# Patient Record
Sex: Female | Born: 2000
Health system: Southern US, Community
[De-identification: ages and names within clinical notes are randomized; demographics above are authoritative.]

## PROBLEM LIST (undated history)

## (undated) HISTORY — PX: OTHER SURGICAL HISTORY: SHX169

---
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
## 2018-04-20 DIAGNOSIS — Z0000 Encounter for general adult medical examination without abnormal findings: Secondary | ICD-10-CM | POA: Diagnosis not present

## 2018-04-20 DIAGNOSIS — Z68.41 Body mass index (BMI) pediatric, 5th percentile to less than 85th percentile for age: Secondary | ICD-10-CM | POA: Diagnosis not present

## 2018-04-20 DIAGNOSIS — Z23 Encounter for immunization: Secondary | ICD-10-CM | POA: Diagnosis not present

## 2018-04-20 DIAGNOSIS — Z713 Dietary counseling and surveillance: Secondary | ICD-10-CM | POA: Diagnosis not present

## 2018-04-21 DIAGNOSIS — Z0000 Encounter for general adult medical examination without abnormal findings: Secondary | ICD-10-CM | POA: Diagnosis not present

## 2018-07-21 DIAGNOSIS — H00014 Hordeolum externum left upper eyelid: Secondary | ICD-10-CM | POA: Diagnosis not present

## 2019-06-25 DIAGNOSIS — Z7189 Other specified counseling: Secondary | ICD-10-CM | POA: Diagnosis not present

## 2019-06-25 DIAGNOSIS — Z20828 Contact with and (suspected) exposure to other viral communicable diseases: Secondary | ICD-10-CM | POA: Diagnosis not present

## 2019-07-13 DIAGNOSIS — Z20828 Contact with and (suspected) exposure to other viral communicable diseases: Secondary | ICD-10-CM | POA: Diagnosis not present

## 2019-08-17 DIAGNOSIS — Z20828 Contact with and (suspected) exposure to other viral communicable diseases: Secondary | ICD-10-CM | POA: Diagnosis not present

## 2019-09-07 DIAGNOSIS — R05 Cough: Secondary | ICD-10-CM | POA: Diagnosis not present

## 2019-09-07 DIAGNOSIS — Z20828 Contact with and (suspected) exposure to other viral communicable diseases: Secondary | ICD-10-CM | POA: Diagnosis not present

## 2019-09-07 DIAGNOSIS — Z7189 Other specified counseling: Secondary | ICD-10-CM | POA: Diagnosis not present

## 2020-01-06 DIAGNOSIS — Z1389 Encounter for screening for other disorder: Secondary | ICD-10-CM | POA: Diagnosis not present

## 2020-01-06 DIAGNOSIS — R52 Pain, unspecified: Secondary | ICD-10-CM | POA: Diagnosis not present

## 2020-01-06 DIAGNOSIS — R5382 Chronic fatigue, unspecified: Secondary | ICD-10-CM | POA: Diagnosis not present

## 2020-01-06 DIAGNOSIS — Z13220 Encounter for screening for lipoid disorders: Secondary | ICD-10-CM | POA: Diagnosis not present

## 2020-01-06 DIAGNOSIS — R519 Headache, unspecified: Secondary | ICD-10-CM | POA: Diagnosis not present

## 2020-01-06 DIAGNOSIS — H53133 Sudden visual loss, bilateral: Secondary | ICD-10-CM | POA: Diagnosis not present

## 2020-01-10 ENCOUNTER — Encounter: Payer: Self-pay | Admitting: Neurology

## 2020-01-13 DIAGNOSIS — H532 Diplopia: Secondary | ICD-10-CM | POA: Diagnosis not present

## 2020-01-13 DIAGNOSIS — H5213 Myopia, bilateral: Secondary | ICD-10-CM | POA: Diagnosis not present

## 2020-01-22 ENCOUNTER — Ambulatory Visit: Payer: BC Managed Care – PPO | Attending: Internal Medicine

## 2020-01-22 DIAGNOSIS — Z23 Encounter for immunization: Secondary | ICD-10-CM

## 2020-01-22 NOTE — Progress Notes (Signed)
   Covid-19 Vaccination Clinic  Name:  Leocadia Idleman    MRN: [id number] DOB: Mar 21, 2001  01/22/2020    Alcaide was observed post Covid-19 immunization for 15 minutes without incident. Rubie Maid was provided with Vaccine Information Sheet and instruction to access the V-Safe system.     Miskell was instructed to call 911 with any severe reactions post vaccine: Marland Kitchen Difficulty breathing  . Swelling of face and throat  . A fast heartbeat  . A bad rash all over body  . Dizziness and weakness   Immunizations Administered    Name Date Dose VIS Date Route   Pfizer COVID-19 Vaccine 01/22/2020 12:16 PM 0.3 mL 10/22/2019 Intramuscular   Manufacturer: [employer], [employer]   Lot: [id number]   NDC: [address]-7

## 2020-01-25 DIAGNOSIS — Z7189 Other specified counseling: Secondary | ICD-10-CM | POA: Diagnosis not present

## 2020-01-25 DIAGNOSIS — Z20828 Contact with and (suspected) exposure to other viral communicable diseases: Secondary | ICD-10-CM | POA: Diagnosis not present

## 2020-02-14 ENCOUNTER — Ambulatory Visit: Payer: BC Managed Care – PPO | Attending: Internal Medicine

## 2020-02-14 ENCOUNTER — Ambulatory Visit: Payer: BC Managed Care – PPO

## 2020-02-14 DIAGNOSIS — Z23 Encounter for immunization: Secondary | ICD-10-CM

## 2020-02-14 NOTE — Progress Notes (Signed)
   Covid-19 Vaccination Clinic  Name:  Margurite Duffy    MRN: [id number] DOB: [birth year]  02/14/2020    Maslin was observed post Covid-19 immunization for 15 minutes without incident. Rubie Maid was provided with Vaccine Information Sheet and instruction to access the V-Safe system.     Balthazar was instructed to call 911 with any severe reactions post vaccine: Marland Kitchen Difficulty breathing  . Swelling of face and throat  . A fast heartbeat  . A bad rash all over body  . Dizziness and weakness   Immunizations Administered    Name Date Dose VIS Date Route   Pfizer COVID-19 Vaccine 02/14/2020  9:00 AM 0.3 mL 10/22/2019 Intramuscular   Manufacturer: [employer], [employer]   Lot: [id number]   NDC: [address]-6

## 2020-02-15 DIAGNOSIS — Z03818 Encounter for observation for suspected exposure to other biological agents ruled out: Secondary | ICD-10-CM | POA: Diagnosis not present

## 2020-03-03 ENCOUNTER — Encounter: Payer: Self-pay | Admitting: Neurology

## 2020-03-03 ENCOUNTER — Other Ambulatory Visit: Payer: Self-pay

## 2020-03-03 ENCOUNTER — Ambulatory Visit (INDEPENDENT_AMBULATORY_CARE_PROVIDER_SITE_OTHER): Payer: BC Managed Care – PPO | Admitting: Neurology

## 2020-03-03 ENCOUNTER — Other Ambulatory Visit: Payer: BC Managed Care – PPO

## 2020-03-03 VITALS — BP 114/75 | HR 100 | Ht 64.0 in | Wt 135.0 lb

## 2020-03-03 DIAGNOSIS — M792 Neuralgia and neuritis, unspecified: Secondary | ICD-10-CM

## 2020-03-03 DIAGNOSIS — G4489 Other headache syndrome: Secondary | ICD-10-CM | POA: Diagnosis not present

## 2020-03-03 DIAGNOSIS — H543 Unqualified visual loss, both eyes: Secondary | ICD-10-CM | POA: Diagnosis not present

## 2020-03-03 DIAGNOSIS — M62838 Other muscle spasm: Secondary | ICD-10-CM

## 2020-03-03 DIAGNOSIS — R413 Other amnesia: Secondary | ICD-10-CM

## 2020-03-03 LAB — CK: Total CK: 88 U/L (ref 7–177)

## 2020-03-03 LAB — MAGNESIUM: Magnesium: 2.1 mg/dL (ref 1.5–2.5)

## 2020-03-03 MED ORDER — NORTRIPTYLINE HCL 10 MG PO CAPS: 10.0000 mg | ORAL_CAPSULE | Freq: Every day | ORAL | 3 refills | Status: DC

## 2020-03-03 NOTE — Patient Instructions (Signed)
1.  Start nortriptyline 10mg  at bedtime for headache and pain.  We can increase dose in 4 to 6 weeks if needed. 2.  We will check MRI of brain and cervical spine with and without contrast 3.  We will check CK and Mg levels 4.  Follow up in 4 months.

## 2020-03-03 NOTE — Progress Notes (Addendum)
NEUROLOGY CONSULTATION NOTE  Yvonne Oneill MRN: [id number] DOB: 26-Nov-2000  Referring provider: Thayer Ohm, PA Primary care provider: Thayer Ohm, PA  Reason for consult:  Headache, bilateral upper and lower extremity pain and weakness, episodic bilateral vision loss, chronic fatigue  HISTORY OF PRESENT ILLNESS: Yvonne Oneill is a 19 year old left-handed white female who presents for above.  History supplemented by referring provider's note.  Headache: Onset:  2nd grade.  Mild right temporal squeezing pain.  NO associated nausea, vomiting, photophobia, phonophobia or visual disturbance.  Lasts a couple of hours.  Headache frequency varies from 5 to 6 in a month or every 2 months.  Advil and Biofreeze helps.  Ice pack is ineffective.    Upper And Lower Extremity Pain: Onset:  3 years ago.  He reports sharp electric or twitching pain lasting seconds to minutes in the arms and legs that occur daily, mostly in left arm and right leg.  Rarely, the pain lasts up to several hours.  It was initially intermittent, occurring every couple of weeks.  Now it is daily.  Left leg goes numb frequently.  Generalized weakness but no focal weakness.  Episodic Bilateral Vision Loss: Onset:  Third grade.  It lasted 15 seconds.  Complete black out of vision but feels eyes moving.  Lasts several seconds.  No loss of consciousness.  Occurs once every one or two weeks.  Occurs separate from his headaches.  Memory deficits: Reports short term memory deficits over the past 5 months.  He is a Production assistant, radio at [employer].  He is doing well in school.   Others: Since October, he coughs after eating. Fatigue Sometimes his left hand shakes   She has been worked up by ophthalmology, which was unremarkable.  01/07/2020 LABS:  CBC with WBC 6.3, HGB 13, HCT 40.4, PLT 255; CMP with Na 141, K 4.3, Cl 105, CO2 28, glucose 87, BUN 4, Cr 0.71; TSH 0.96  Current medications:  B12 1063mcg  daily; iron 65mg  daily; testosterone shots  Past medications for depression:  Zoloft; Lexapro  Family History:  Maternal grandmother (fibromyalgia)   PAST MEDICAL HISTORY: No past medical history on file.  PAST SURGICAL HISTORY: No past surgical history on file.  MEDICATIONS: Current Outpatient Medications on File Prior to Visit  Medication Sig Dispense Refill  . Pediatric Multivit-Minerals-C ([facility] GUMMY DINOS) CHEW Chew 2 tablets by mouth daily.     No current facility-administered medications on file prior to visit.    ALLERGIES: NKDA  FAMILY HISTORY: As above.  SOCIAL HISTORY: Single Student at [employer]: Blood pressure 114/75, pulse 100, height 5\' 4"  (1.626 m), weight 135 lb (61.2 kg), SpO2 99 %. General: No acute distress.  Patient appears well-groomed.   Head:  Normocephalic/atraumatic Eyes:  fundi examined but not visualized Neck: supple, no paraspinal tenderness, full range of motion Back: No paraspinal tenderness Heart: regular rate and rhythm Lungs: Clear to auscultation bilaterally. Vascular: No carotid bruits. Neurological Exam: Mental status: alert and oriented to person, place, and time, recent and remote memory intact, fund of knowledge intact, attention and concentration intact, speech fluent and not dysarthric, language intact. Cranial nerves: CN I: not tested CN II: pupils equal, round and reactive to light, visual fields intact CN III, IV, VI:  full range of motion, no nystagmus, no ptosis CN V: facial sensation intact CN VII: upper and lower face symmetric CN VIII: hearing intact CN IX, X: gag intact, uvula  midline CN XI: sternocleidomastoid and trapezius muscles intact CN XII: tongue midline Bulk & Tone: normal, no fasciculations. Motor:  5/5 throughout  Sensation:  Pinprick and vibration sensation intact. Deep Tendon Reflexes:  2+ throughout, toes downgoing.  Finger to nose testing:  Without dysmetria.  Heel to shin:   Without dysmetria.  Gait:  Normal station and stride.  Able to turn and tandem walk. Romberg negative.  IMPRESSION: 1.  Headaches 2.  Bilateral upper and and lower extremity pain 3.  Transient recurrent vision loss 4.  Memory deficits 5.  Muscle spasms  PLAN: 1.  Will first check MRI of brain and cervical spine with and without contrast to evaluate for CNS etiology of symptoms 2.  Check CK and Mg for possible cause of muscle spasms 3.  Nortriptyline 10mg  at bedtime for headache and neuralgia pain 4.  Follow up in 4 months.  Thank you for allowing me to take part in the care of this patient.  , DO  CC: Shon Millet, PA

## 2020-03-06 ENCOUNTER — Other Ambulatory Visit: Payer: Self-pay

## 2020-03-06 DIAGNOSIS — G35 Multiple sclerosis: Secondary | ICD-10-CM

## 2020-03-06 NOTE — Progress Notes (Signed)
MRI of the Brain and Cervical spine ordered

## 2020-03-07 ENCOUNTER — Telehealth: Payer: Self-pay

## 2020-03-07 DIAGNOSIS — Z03818 Encounter for observation for suspected exposure to other biological agents ruled out: Secondary | ICD-10-CM | POA: Diagnosis not present

## 2020-03-07 NOTE — Telephone Encounter (Signed)
Spoke with patient and he stated he would like to have his MRI at [facility]. He states he would like an appt in the afternoon and he does not have day preference.   Contacted [facility] and she scheduler stated she will contact the patient and schedule appointment directly.

## 2020-03-07 NOTE — Telephone Encounter (Signed)
Yvonne Oneill, [place]  [id number] valid until 05/22       Previous Messages   ----- Message -----  From: Lucita Ferrara, CMA  Sent: 03/06/2020 11:18 AM EDT  To: Marylynn Pearson  Subject: RE: mri of brain and cervical spine        Thanks!  ----- Message -----  From: Tawni Levy I  Sent: 03/06/2020  8:35 AM EDT  To: Lucita Ferrara, CMA  Subject: RE: mri of brain and cervical spine        It's in review  ----- Message -----  From: Lucita Ferrara, CMA  Sent: 03/06/2020  8:29 AM EDT  To: Marylynn Pearson  Subject: mri of brain and cervical spine          Hello,   This patient needs a MRI of the brain and the cervical spine.    Thanks,  [employer]

## 2020-04-04 ENCOUNTER — Ambulatory Visit
Admission: RE | Admit: 2020-04-04 | Discharge: 2020-04-04 | Disposition: A | Discharge status: Home / Self Care | Payer: BC Managed Care – PPO | Source: Ambulatory Visit | Attending: Neurology | Admitting: Neurology

## 2020-04-04 ENCOUNTER — Other Ambulatory Visit: Payer: Self-pay

## 2020-04-04 DIAGNOSIS — G35 Multiple sclerosis: Secondary | ICD-10-CM

## 2020-04-04 MED ORDER — GADOBENATE DIMEGLUMINE 529 MG/ML IV SOLN
11.0000 mL | Freq: Once | INTRAVENOUS | Status: AC | PRN
  Administered 2020-04-04: 11 mL via INTRAVENOUS

## 2020-04-06 DIAGNOSIS — F640 Transsexualism: Secondary | ICD-10-CM | POA: Diagnosis not present

## 2020-04-06 DIAGNOSIS — Z1331 Encounter for screening for depression: Secondary | ICD-10-CM | POA: Diagnosis not present

## 2020-04-07 ENCOUNTER — Other Ambulatory Visit: Payer: Self-pay

## 2020-04-07 DIAGNOSIS — R202 Paresthesia of skin: Secondary | ICD-10-CM

## 2020-04-07 DIAGNOSIS — M792 Neuralgia and neuritis, unspecified: Secondary | ICD-10-CM

## 2020-05-18 DIAGNOSIS — F649 Gender identity disorder, unspecified: Secondary | ICD-10-CM | POA: Diagnosis not present

## 2020-05-22 DIAGNOSIS — F649 Gender identity disorder, unspecified: Secondary | ICD-10-CM | POA: Diagnosis not present

## 2020-06-21 ENCOUNTER — Encounter: Payer: BC Managed Care – PPO | Admitting: Neurology

## 2020-06-29 ENCOUNTER — Other Ambulatory Visit: Payer: Self-pay | Admitting: Neurology

## 2020-07-19 ENCOUNTER — Other Ambulatory Visit: Payer: Self-pay

## 2020-07-19 ENCOUNTER — Ambulatory Visit (INDEPENDENT_AMBULATORY_CARE_PROVIDER_SITE_OTHER): Payer: BC Managed Care – PPO | Admitting: Neurology

## 2020-07-19 DIAGNOSIS — R202 Paresthesia of skin: Secondary | ICD-10-CM

## 2020-07-19 DIAGNOSIS — M792 Neuralgia and neuritis, unspecified: Secondary | ICD-10-CM

## 2020-07-19 NOTE — Procedures (Signed)
[facility]  [address] [place], [address]  [place], [place] [address] Tel: [phone] Fax:  [phone] Test Date:  07/19/2020  Patient: Yvonne Oneill DOB: 2001/02/17 Physician: Nita Sickle, DO  Sex: Female Height: 5\' 4"  Ref Phys: , D.O.  ID#: Shon Millet   Technician:    Patient Complaints: This is a 19 year old female referred for evaluation of generalized pain and paresthesias.  NCV & EMG Findings: Extensive electrodiagnostic testing of the left upper and lower extremity shows:  1. All sensory responses including the left median, ulnar, mixed palmar, sural, and superficial peroneal nerves are within normal limits. 2. All motor responses including the left median, ulnar, peroneal, and tibial nerves are within normal limits. 3. Left tibial H reflex study is within normal limits. 4. There is no evidence of active or chronic motor axonal loss changes affecting any of the tested muscles.  Motor unit configuration and recruitment pattern is within normal limits.  Impression: This is a normal study of the left upper and lower extremities.  In particular, there is no evidence of a large fiber sensorimotor polyneuropathy, cervical/lumbosacral radiculopathy, diffuse myopathy, or carpal tunnel syndrome.   ___________________________ 26, DO    Nerve Conduction Studies Anti Sensory Summary Table   Stim Site NR Peak (ms) Norm Peak (ms) P-T Amp (V) Norm P-T Amp  Left Median Anti Sensory (2nd Digit)  33C  Wrist    2.8 <3.3 69.9 >20  Left Sup Peroneal Anti Sensory (Ant Lat Mall)  33C  12 cm    2.9 <4.4 25.9 >6  Left Sural Anti Sensory (Lat Mall)  33C  Calf    3.0 <4.4 33.1 >6  Left Ulnar Anti Sensory (5th Digit)  33C  Wrist    2.6 <3.0 68.6 >18   Motor Summary Table   Stim Site NR Onset (ms) Norm Onset (ms) O-P Amp (mV) Norm O-P Amp Site1 Site2 Delta-0 (ms) Dist (cm) Vel (m/s) Norm Vel (m/s)  Left Median Motor (Abd Poll Brev)  33C  Wrist    2.7 <3.9  13.0 >6 Elbow Wrist 4.0 26.0 65 >51  Elbow    6.7  12.8         Left Peroneal Motor (Ext Dig Brev)  33C  Ankle    3.4 <5.5 8.8 >3 B Fib Ankle 6.3 35.0 56 >41  B Fib    9.7  8.5  Poplt B Fib 1.6 8.0 50 >41  Poplt    11.3  8.3         Left Tibial Motor (Abd Hall Brev)  33C  Ankle    3.2 <5.8 19.9 >8 Knee Ankle 7.2 38.0 53 >41  Knee    10.4  15.5         Left Ulnar Motor (Abd Dig Minimi)  33C  Wrist    2.2 <3.0 10.8 >8 B Elbow Wrist 3.2 20.0 63 >51  B Elbow    5.4  9.3  A Elbow B Elbow 1.6 10.0 63 >51  A Elbow    7.0  9.3          Comparison Summary Table   Stim Site NR Peak (ms) Norm Peak (ms) P-T Amp (V) Site1 Site2 Delta-P (ms) Norm Delta (ms)  Left Median/Ulnar Palm Comparison (Wrist - 8cm)  33C  Median Palm    1.3 <2.2 94.2 Median Palm Ulnar Palm 0.0   Ulnar Palm    1.3 <2.2 27.4       H Reflex Studies  NR H-Lat (ms) Lat Norm (ms) L-R H-Lat (ms)  Left Tibial (Gastroc)  33C     29.52 <35    EMG   Side Muscle Ins Act Fibs Psw Fasc Number Recrt Dur Dur. Amp Amp. Poly Poly. Comment  Left AntTibialis Nml Nml Nml Nml Nml Nml Nml Nml Nml Nml Nml Nml N/A  Left Gastroc Nml Nml Nml Nml Nml Nml Nml Nml Nml Nml Nml Nml N/A  Left Flex Dig Long Nml Nml Nml Nml Nml Nml Nml Nml Nml Nml Nml Nml N/A  Left RectFemoris Nml Nml Nml Nml Nml Nml Nml Nml Nml Nml Nml Nml N/A  Left GluteusMed Nml Nml Nml Nml Nml Nml Nml Nml Nml Nml Nml Nml N/A  Left 1stDorInt Nml Nml Nml Nml Nml Nml Nml Nml Nml Nml Nml Nml N/A  Left PronatorTeres Nml Nml Nml Nml Nml Nml Nml Nml Nml Nml Nml Nml N/A  Left Biceps Nml Nml Nml Nml Nml Nml Nml Nml Nml Nml Nml Nml N/A  Left Triceps Nml Nml Nml Nml Nml Nml Nml Nml Nml Nml Nml Nml N/A  Left Deltoid Nml Nml Nml Nml Nml Nml Nml Nml Nml Nml Nml Nml N/A      Waveforms:

## 2020-07-19 NOTE — Progress Notes (Signed)
NEUROLOGY FOLLOW UP OFFICE NOTE  Yvonne Oneill [id number]  HISTORY OF PRESENT ILLNESS: Yvonne Oneill is a 19 year old left-handed transgender female who follows up for headache, bilateral upper and lower extremity pain and weakness, episodic bilateral vision loss and chronic fatigue.  UPDATE: CK and Mg from 03/03/2020 were 88 and 2.1 respectively.  MRI of brain and cervical spine with and without contrast on 04/04/2020 personally reviewed was unremarkable.  Incidentally, cervical spine did show mild spondylosis without neural impingement.  NCV-EMG yesterday which was normal.  He continues to have twitching in back of the legs and arms.  Not experiencing as much shooting pain.  For headache and pain, he was started on nortriptyline.  He started having severe headaches over the summer and needed to have the top two wisdom teeth pulled in early August, which helped.  Over the past 4 weeks, he had 3 typical headaches, lasting a couple of hours.  Over the summer, he experienced blurred vision off and on for 30 minutes over the course of a week.  Current NSAIDs/analgesics:  Advil Current antidepressant:  Nortriptyline 10mg  Current vitamins/supplements:  B12 daily; iron 65mg  daily Current steroids:  testosterone shots  HISTORY: Headache: Onset:  2nd grade.  Mild right temporal squeezing pain.  No associated nausea, vomiting, photophobia, phonophobia or visual disturbance.  Lasts a couple of hours.  Headache frequency varies from 5 to 6 in a month or every 2 months.  Advil and Biofreeze helps.  Ice pack is ineffective.    Upper And Lower Extremity Pain: Onset:  3 years ago.  He reports sharp electric or twitching pain lasting seconds to minutes in the arms and legs that occur daily, mostly in left arm and right leg.  Rarely, the pain lasts up to several hours.  It was initially intermittent, occurring every couple of weeks.  Now it is daily.  Left leg goes numb frequently.   Generalized weakness but no focal weakness.  Episodic Bilateral Vision Loss: Onset:  Third grade.  It lasted 15 seconds.  Complete black out of vision but feels eyes moving.  Lasts several seconds.  No loss of consciousness.  Occurs once every one or two weeks.  Occurs separate from his headaches.  Ophthalmology evaluation was unremarkable.  Memory deficits: Reports short term memory deficits over the past 5 months.  He is a at .  He is doing well in school.   Others: Since October, he coughs after eating. Fatigue Sometimes his left hand shakes   Past medications for depression:  Zoloft; Lexapro  Family History:  Maternal grandmother (fibromyalgia)   PAST MEDICAL HISTORY: No past medical history on file.  MEDICATIONS: Current Outpatient Medications on File Prior to Visit  Medication Sig Dispense Refill  . nortriptyline (PAMELOR) 10 MG capsule Take 1 capsule by mouth at bedtime 30 capsule 0  . testosterone cypionate (DEPOTESTOSTERONE CYPIONATE) 200 MG/ML injection Inject into the muscle. injectr 3.74ml once a week    . vitamin B-12 (CYANOCOBALAMIN) 500 MCG tablet Take 500 mcg by mouth daily.     No current facility-administered medications on file prior to visit.    ALLERGIES: No Known Allergies  FAMILY HISTORY: As above  SOCIAL HISTORY: Social History   Socioeconomic History  . Marital status: Single    Spouse name: Not on file  . Number of children: Not on file  . Years of education: Not on file  . Highest education level: Not on  file  Occupational History  . Not on file  Tobacco Use  . Smoking status: Never Smoker  . Smokeless tobacco: Never Used  Vaping Use  . Vaping Use: Never used  Substance and Sexual Activity  . Alcohol use: No  . Drug use: No  . Sexual activity: Not on file  Other Topics Concern  . Not on file  Social History Narrative  . Not on file   Social Determinants of Health   Financial Resource  Strain:   . Difficulty of Paying Living Expenses: Not on file  Food Insecurity:   . Worried About Programme researcher, broadcasting/film/video in the Last Year: Not on file  . Ran Out of Food in the Last Year: Not on file  Transportation Needs:   . Lack of Transportation (Medical): Not on file  . Lack of Transportation (Non-Medical): Not on file  Physical Activity:   . Days of Exercise per Week: Not on file  . Minutes of Exercise per Session: Not on file  Stress:   . Feeling of Stress : Not on file  Social Connections:   . Frequency of Communication with Friends and Family: Not on file  . Frequency of Social Gatherings with Friends and Family: Not on file  . Attends Religious Services: Not on file  . Active Member of Clubs or Organizations: Not on file  . Attends Banker Meetings: Not on file  . Marital Status: Not on file  Intimate Partner Violence:   . Fear of Current or Ex-Partner: Not on file  . Emotionally Abused: Not on file  . Physically Abused: Not on file  . Sexually Abused: Not on file    PHYSICAL EXAM: Blood pressure 122/79, pulse (!) 110, height 5\' 5"  (1.651 m), weight 124 lb 12.8 oz (56.6 kg), SpO2 100 %. General: No acute distress.  Patient appears well-groomed. Head:  Normocephalic/atraumatic Eyes:  Fundi examined but not visualized Neck: supple, no paraspinal tenderness, full range of motion Heart:  Regular rate and rhythm Lungs:  Clear to auscultation bilaterally Back: No paraspinal tenderness Neurological Exam: alert and oriented to person, place, and time. Attention span and concentration intact, recent and remote memory intact, fund of knowledge intact.  Speech fluent and not dysarthric, language intact.  Decreased right V2-V3.  Otherwise, CN II-XII intact. Bulk and tone normal, muscle strength 5/5 throughout.  Sensation to light touch, temperature and vibration intact.  Deep tendon reflexes 2+ throughout, toes downgoing.  Finger to nose and heel to shin testing intact.   Gait normal, Romberg negative.  IMPRESSION: 1.  Headaches 2.  Diffuse extremity pain 3.  Recurrent transient vision loss 4.  Memory deficits 5.  Muscle spasms  Workup negative for any explainable neurologic cause of pain, muscle spasms or recurrent transient vision loss.  Pain is improved overall, possibly due to nortriptyline.  PLAN: 1.  Continue nortriptyline 10mg  at bedtime.  Refilled today. 2.  Follow up in 6 months.  , DO  CC:  , PA

## 2020-07-20 ENCOUNTER — Ambulatory Visit (INDEPENDENT_AMBULATORY_CARE_PROVIDER_SITE_OTHER): Payer: BC Managed Care – PPO | Admitting: Neurology

## 2020-07-20 ENCOUNTER — Telehealth: Payer: Self-pay

## 2020-07-20 ENCOUNTER — Encounter: Payer: Self-pay | Admitting: Neurology

## 2020-07-20 VITALS — BP 122/79 | HR 110 | Ht 65.0 in | Wt 124.8 lb

## 2020-07-20 DIAGNOSIS — H543 Unqualified visual loss, both eyes: Secondary | ICD-10-CM | POA: Diagnosis not present

## 2020-07-20 DIAGNOSIS — R202 Paresthesia of skin: Secondary | ICD-10-CM | POA: Diagnosis not present

## 2020-07-20 DIAGNOSIS — G4489 Other headache syndrome: Secondary | ICD-10-CM

## 2020-07-20 DIAGNOSIS — M62838 Other muscle spasm: Secondary | ICD-10-CM | POA: Diagnosis not present

## 2020-07-20 MED ORDER — NORTRIPTYLINE HCL 10 MG PO CAPS: 10.0000 mg | ORAL_CAPSULE | Freq: Every day | ORAL | 5 refills | Status: DC

## 2020-07-20 NOTE — Patient Instructions (Signed)
Continue nortriptyline 10mg at bedtime. Follow up in 6 months. 

## 2020-07-20 NOTE — Telephone Encounter (Signed)
Pt advised of EMG

## 2020-07-24 DIAGNOSIS — Z03818 Encounter for observation for suspected exposure to other biological agents ruled out: Secondary | ICD-10-CM | POA: Diagnosis not present

## 2020-08-11 DIAGNOSIS — Z03818 Encounter for observation for suspected exposure to other biological agents ruled out: Secondary | ICD-10-CM | POA: Diagnosis not present

## 2020-08-14 DIAGNOSIS — Z03818 Encounter for observation for suspected exposure to other biological agents ruled out: Secondary | ICD-10-CM | POA: Diagnosis not present

## 2020-08-17 ENCOUNTER — Ambulatory Visit (HOSPITAL_COMMUNITY)
Admission: EM | Admit: 2020-08-17 | Discharge: 2020-08-17 | Disposition: A | Discharge status: Home / Self Care | Payer: BC Managed Care – PPO | Attending: Internal Medicine | Admitting: Internal Medicine

## 2020-08-17 ENCOUNTER — Encounter (HOSPITAL_COMMUNITY): Payer: Self-pay

## 2020-08-17 ENCOUNTER — Other Ambulatory Visit: Payer: Self-pay

## 2020-08-17 DIAGNOSIS — R0789 Other chest pain: Secondary | ICD-10-CM

## 2020-08-17 MED ORDER — IBUPROFEN 600 MG PO TABS: 600.0000 mg | ORAL_TABLET | Freq: Four times a day (QID) | ORAL | 0 refills | Status: AC | PRN

## 2020-08-17 NOTE — ED Notes (Signed)
Evaluated by Dr. Leonides Grills. Patient d/c home

## 2020-08-17 NOTE — ED Triage Notes (Addendum)
Patient in with c/o squeezing left chest pain that started 3 days ago that worsened this morning. Had some nausea that subsided  Patient has not taken any medication for CP  Had similar symptoms over past 4 years , but recently back in February but was not evaluated  Pain is worsened with deep breaths and upon exertion, feels better with rest  Denies any vomiting, dizziness, sweating,sob Denies radiation to jaw or arm or other extremities No known hx of arrhythmias  Received vaccine booster on Monday and cp started approx 6 hours after   Lamptey, MD notified of patient status and EKG reviewed. In triage for evaluation

## 2020-08-17 NOTE — Discharge Instructions (Signed)
Please take ibuprofen as needed for chest pain If the chest pain worsens or stays persistent please return to urgent care to be reevaluated. If you experience shortness of breath, dizziness, breaking out in cold sweat or persistent nausea/vomiting please return to urgent care to be reevaluated.

## 2020-08-18 NOTE — ED Provider Notes (Signed)
[facility]    CSN: [id number] Arrival date & time: 08/17/20  1109      History   Chief Complaint Chief Complaint  Patient presents with  . Chest Pain    HPI Yvonne Oneill is a 18 y.o. adult comes to the urgent care with sharp precordial chest pain which started 6 hours after he had his third Pfizer vaccine.  Patient describes the pain as sharp, in the precordial region, aggravated by laying on his back and relieved when he sits forward.  No radiation of pain into the neck or left shoulder.  No fever or chills.  No nausea or vomiting.  He denies similar symptoms in the past.  No reactions to the previous Pfizer vaccines. HPI  History reviewed. No pertinent past medical history.  There are no problems to display for this patient.   Past Surgical History:  Procedure Laterality Date  . wisdom tooth removal      OB History   No obstetric history on file.      Home Medications    Prior to Admission medications   Medication Sig Start Date End Date Taking? Authorizing Provider  nortriptyline (PAMELOR) 10 MG capsule Take 1 capsule (10 mg total) by mouth at bedtime. 07/20/20  Yes Jaffe, Adam R, DO  testosterone cypionate (DEPOTESTOSTERONE CYPIONATE) 200 MG/ML injection Inject into the muscle. injectr 3.76ml once a week   Yes [provider]  vitamin B-12 (CYANOCOBALAMIN) 500 MCG tablet Take 500 mcg by mouth daily.   Yes [provider]  ibuprofen (ADVIL) 600 MG tablet Take 1 tablet (600 mg total) by mouth every 6 (six) hours as needed. 08/17/20   LampteyBritta Mccreedy, MD    Family History Family History  Problem Relation Age of Onset  . Healthy Mother   . Healthy Father     Social History Social History   Tobacco Use  . Smoking status: Never Smoker  . Smokeless tobacco: Never Used  Vaping Use  . Vaping Use: Never used  Substance Use Topics  . Alcohol use: No  . Drug use: No     Allergies   Patient has no known allergies.   Review  of Systems Review of Systems  Constitutional: Negative.   Respiratory: Negative for cough, chest tightness, shortness of breath and wheezing.   Cardiovascular: Positive for chest pain. Negative for palpitations.  Gastrointestinal: Negative.   Genitourinary: Negative.   Neurological: Negative.      Physical Exam Triage Vital Signs ED Triage Vitals  Enc Vitals Group     BP 08/17/20 1135 114/69     Pulse Rate 08/17/20 1135 88     Resp 08/17/20 1135 20     Temp --      Temp src --      SpO2 08/17/20 1135 100 %     Weight --      Height --      Head Circumference --      Peak Flow --      Pain Score 08/17/20 1138 2     Pain Loc --      Pain Edu? --      Excl. in GC? --    No data found.  Updated Vital Signs BP 114/69 (BP Location: Right Arm)   Pulse 88   Resp 20   SpO2 100%   Visual Acuity Right Eye Distance:   Left Eye Distance:   Bilateral Distance:    Right Eye Near:  Left Eye Near:    Bilateral Near:     Physical Exam Vitals and nursing note reviewed.  Constitutional:      General: He is not in acute distress.    Appearance: He is not ill-appearing.  Cardiovascular:     Rate and Rhythm: Normal rate and regular rhythm.     Heart sounds: Normal heart sounds. Heart sounds not distant. No murmur heard.   Pulmonary:     Effort: Pulmonary effort is normal. No tachypnea, accessory muscle usage or respiratory distress.     Breath sounds: Normal breath sounds. No decreased breath sounds, wheezing or rhonchi.  Abdominal:     General: Bowel sounds are normal.     Palpations: Abdomen is soft.  Neurological:     Mental Status: He is alert.      UC Treatments / Results  Labs (all labs ordered are listed, but only abnormal results are displayed) Labs Reviewed - No data to display  EKG   Radiology No results found.  Procedures Procedures (including critical care time)  Medications Ordered in UC Medications - No data to display  Initial Impression /  Assessment and Plan / UC Course  I have reviewed the triage vital signs and the nursing notes.  Pertinent labs & imaging results that were available during my care of the patient were reviewed by me and considered in my medical decision making (see chart for details).     1.  Chest pain is concerning for vaccine associated pericarditis: EKG showed normal sinus rhythm with no ST/T wave abnormalities Patient is advised to take NSAIDs as needed for pain Return precautions given. Patient is hemodynamically stable. Final Clinical Impressions(s) / UC Diagnoses   Final diagnoses:  Other chest pain     Discharge Instructions     Please take ibuprofen as needed for chest pain If the chest pain worsens or stays persistent please return to urgent care to be reevaluated. If you experience shortness of breath, dizziness, breaking out in cold sweat or persistent nausea/vomiting please return to urgent care to be reevaluated.   ED Prescriptions    Medication Sig Dispense Auth. Provider   ibuprofen (ADVIL) 600 MG tablet Take 1 tablet (600 mg total) by mouth every 6 (six) hours as needed. 30 tablet Adylene Dlugosz, Britta Mccreedy, MD     PDMP not reviewed this encounter.   Merrilee Jansky, MD 08/18/20 1355

## 2020-08-21 DIAGNOSIS — R079 Chest pain, unspecified: Secondary | ICD-10-CM | POA: Diagnosis not present

## 2020-08-21 DIAGNOSIS — F640 Transsexualism: Secondary | ICD-10-CM | POA: Diagnosis not present

## 2020-08-30 DIAGNOSIS — F640 Transsexualism: Secondary | ICD-10-CM | POA: Diagnosis not present

## 2020-09-04 DIAGNOSIS — F640 Transsexualism: Secondary | ICD-10-CM | POA: Diagnosis not present

## 2020-10-10 DIAGNOSIS — Z03818 Encounter for observation for suspected exposure to other biological agents ruled out: Secondary | ICD-10-CM | POA: Diagnosis not present

## 2020-12-05 DIAGNOSIS — F640 Transsexualism: Secondary | ICD-10-CM | POA: Diagnosis not present

## 2020-12-05 DIAGNOSIS — M2550 Pain in unspecified joint: Secondary | ICD-10-CM | POA: Diagnosis not present

## 2020-12-18 ENCOUNTER — Other Ambulatory Visit: Payer: Self-pay

## 2020-12-18 MED ORDER — NORTRIPTYLINE HCL 25 MG PO CAPS: ORAL_CAPSULE | ORAL | 0 refills | Status: DC

## 2020-12-18 NOTE — Progress Notes (Signed)
Per Dr.Jaffe increase Nortriptyline to 25 mg at bedtime

## 2021-01-08 DIAGNOSIS — Z0000 Encounter for general adult medical examination without abnormal findings: Secondary | ICD-10-CM | POA: Diagnosis not present

## 2021-01-08 DIAGNOSIS — R5382 Chronic fatigue, unspecified: Secondary | ICD-10-CM | POA: Diagnosis not present

## 2021-01-08 DIAGNOSIS — Z789 Other specified health status: Secondary | ICD-10-CM | POA: Diagnosis not present

## 2021-01-08 DIAGNOSIS — Z23 Encounter for immunization: Secondary | ICD-10-CM | POA: Diagnosis not present

## 2021-01-17 NOTE — Progress Notes (Signed)
NEUROLOGY FOLLOW UP OFFICE NOTE  Yvonne Oneill [id number]  Assessment/Plan:   1.  Headaches 2.  Chronic pain syndrome   1.  Nortriptyline 25mg  at bedtime 2.  Biofreeze/Tylenol as needed. 3.  Limit use of pain relievers to no more than 2 days out of week to prevent risk of rebound or medication-overuse headache. 4.  Keep headache diary 5.  Follow up in 6 months.  Subjective:  is a 19 year oldleft-handed transgender malewho follows up for headache, bilateral upper and lower extremity pain and weakness, episodic bilateral vision loss and chronic fatigue.  UPDATE: Increased nortriptyline 5 weeks ago.  Headaches and shooting pain improved.  Still has joint pain.   Intensity:  severe Duration:  1 to 2 hours Frequency:  2 in last 30 days Current NSAIDs/analgesics:  Advil Current antidepressant:  Nortriptyline 25mg  Current vitamins/supplements:   iron 65mg  daily Current steroids:  testosterone shots Treats with Biofreeze and Tylenol  HISTORY: Headache: Onset: 2nd grade.Mild right temporalsqueezingpain. No associated nausea, vomiting, photophobia, phonophobia or visual disturbance. Lasts a couple of hours.Headache frequency varies from 5 to 6 in a month or every 2 months.Advil and Biofreeze helps. Ice pack is ineffective.   Upper And Lower Extremity Pain: Onset:3 years ago.He reports sharpelectric or twitchingpain lasting seconds to minutes in the arms and legs that occur daily, mostly in left arm and right leg. Rarely, the pain lasts up to several hours. It was initially intermittent, occurring every couple of weeks. Then it is daily. Left leg goes numb frequently. Generalized weakness but no focal weakness.  CK and Mg from 03/03/2020 were 88 and 2.1 respectively.  MRI of brain and cervical spine with and without contrast on 04/04/2020 personally reviewed was unremarkable.  Incidentally, cervical spine did show mild spondylosis without  neural impingement.  NCV-EMG which was normal.    Episodic Bilateral Vision Loss: Onset: Third grade. It lasted 15 seconds. Complete black out of vision but feels eyes moving. Lasts several seconds. No loss of consciousness.Occurs onceevery one or two weeks. Occurs separate from hisheadaches.  Ophthalmology evaluation was unremarkable.  Memory deficits: Reports short term memory deficits over the past 5 months. He is a at 03/05/2020. He is doing well in school.   Others: Since October, he coughs after eating. Fatigue Sometimes his left hand shakes  Past medications for depression: Zoloft; Lexapro  Family History: Maternal grandmother (fibromyalgia)  PAST MEDICAL HISTORY: No past medical history on file.  MEDICATIONS: Current Outpatient Medications on File Prior to Visit  Medication Sig Dispense Refill  . ibuprofen (ADVIL) 600 MG tablet Take 1 tablet (600 mg total) by mouth every 6 (six) hours as needed. 30 tablet 0  . nortriptyline (PAMELOR) 25 MG capsule Take one tablet at bedtime. 30 capsule 0  . testosterone cypionate (DEPOTESTOSTERONE CYPIONATE) 200 MG/ML injection Inject into the muscle. injectr 3.90ml once a week    . vitamin B-12 (CYANOCOBALAMIN) 500 MCG tablet Take 500 mcg by mouth daily.     No current facility-administered medications on file prior to visit.    ALLERGIES: No Known Allergies  FAMILY HISTORY: Family History  Problem Relation Age of Onset  . Healthy Mother   . Healthy Father       Objective:  Blood pressure 109/75, pulse (!) 117, height 5\' 5"  (1.651 m), weight 115 lb 9.6 oz (52.4 kg), SpO2 100 %. General: No acute distress.  Patient appears well-groomed.     [employer],  DO  CC: Eliane Decree, PA

## 2021-01-18 ENCOUNTER — Other Ambulatory Visit: Payer: Self-pay

## 2021-01-18 ENCOUNTER — Ambulatory Visit (INDEPENDENT_AMBULATORY_CARE_PROVIDER_SITE_OTHER): Payer: BC Managed Care – PPO | Admitting: Neurology

## 2021-01-18 ENCOUNTER — Encounter: Payer: Self-pay | Admitting: Neurology

## 2021-01-18 VITALS — BP 109/75 | HR 117 | Ht 65.0 in | Wt 115.6 lb

## 2021-01-18 DIAGNOSIS — G894 Chronic pain syndrome: Secondary | ICD-10-CM

## 2021-01-18 DIAGNOSIS — G4489 Other headache syndrome: Secondary | ICD-10-CM

## 2021-01-18 NOTE — Patient Instructions (Signed)
1.  Nortriptyline 25mg  at bedtime 2.  Biofreeze/Tylenol as needed but Limit use of pain relievers to no more than 2 days out of week to prevent risk of rebound or medication-overuse headache. 3.  Keep headache diary 4.  Follow up 6 months.

## 2021-01-30 ENCOUNTER — Other Ambulatory Visit: Payer: Self-pay | Admitting: Neurology

## 2021-02-09 ENCOUNTER — Ambulatory Visit: Payer: Self-pay

## 2021-02-13 DIAGNOSIS — Z03818 Encounter for observation for suspected exposure to other biological agents ruled out: Secondary | ICD-10-CM | POA: Diagnosis not present

## 2021-02-20 DIAGNOSIS — Z03818 Encounter for observation for suspected exposure to other biological agents ruled out: Secondary | ICD-10-CM | POA: Diagnosis not present

## 2021-02-26 ENCOUNTER — Other Ambulatory Visit: Payer: Self-pay

## 2021-02-26 MED ORDER — NORTRIPTYLINE HCL 25 MG PO CAPS: ORAL_CAPSULE | ORAL | 1 refills | Status: DC

## 2021-02-26 NOTE — Progress Notes (Signed)
Per Everlena Cooper, Yes, we can prescribe a 90 day supply of nortriptyline   Message text

## 2021-03-06 DIAGNOSIS — Z03818 Encounter for observation for suspected exposure to other biological agents ruled out: Secondary | ICD-10-CM | POA: Diagnosis not present

## 2021-03-09 DIAGNOSIS — F640 Transsexualism: Secondary | ICD-10-CM | POA: Diagnosis not present

## 2021-03-13 DIAGNOSIS — Z03818 Encounter for observation for suspected exposure to other biological agents ruled out: Secondary | ICD-10-CM | POA: Diagnosis not present

## 2021-06-25 DIAGNOSIS — R059 Cough, unspecified: Secondary | ICD-10-CM | POA: Diagnosis not present

## 2021-06-25 DIAGNOSIS — Z03818 Encounter for observation for suspected exposure to other biological agents ruled out: Secondary | ICD-10-CM | POA: Diagnosis not present

## 2021-06-25 DIAGNOSIS — J029 Acute pharyngitis, unspecified: Secondary | ICD-10-CM | POA: Diagnosis not present

## 2021-06-25 DIAGNOSIS — R051 Acute cough: Secondary | ICD-10-CM | POA: Diagnosis not present

## 2021-06-26 ENCOUNTER — Encounter (HOSPITAL_COMMUNITY): Payer: Self-pay

## 2021-06-26 ENCOUNTER — Ambulatory Visit (HOSPITAL_COMMUNITY)
Admission: EM | Admit: 2021-06-26 | Discharge: 2021-06-26 | Disposition: A | Discharge status: Home / Self Care | Payer: BC Managed Care – PPO | Attending: Emergency Medicine | Admitting: Emergency Medicine

## 2021-06-26 ENCOUNTER — Other Ambulatory Visit: Payer: Self-pay

## 2021-06-26 DIAGNOSIS — H66002 Acute suppurative otitis media without spontaneous rupture of ear drum, left ear: Secondary | ICD-10-CM | POA: Diagnosis not present

## 2021-06-26 DIAGNOSIS — J029 Acute pharyngitis, unspecified: Secondary | ICD-10-CM

## 2021-06-26 MED ORDER — AMOXICILLIN 500 MG PO CAPS: 500.0000 mg | ORAL_CAPSULE | Freq: Two times a day (BID) | ORAL | 0 refills | Status: AC

## 2021-06-26 NOTE — ED Triage Notes (Signed)
Pt presents with a sore throat and cough X 2 days.   States in the past hours pt states they cannot hear from the left ear. Pt c/o of fatigue.   Pt states they were seen and tested for Strep Infection and COVID, resulting negative at [facility] clinic.

## 2021-06-26 NOTE — Discharge Instructions (Addendum)
Take the amoxicillin twice a day for the next 10 days.    You can take Tylenol and/or Ibuprofen as needed for pain relief and fever reduction.   Return or go to the Emergency Department if symptoms worsen or do not improve in the next few days.

## 2021-06-26 NOTE — ED Provider Notes (Signed)
[facility]    CSN: [id number] Arrival date & time: 06/26/21  1906      History   Chief Complaint Chief Complaint  Patient presents with   Sore Throat   Cough   Otalgia    HPI Yvonne Oneill is a 20 y.o. adult.   Patient here for evaluation of cough and sore throat that has been ongoing for the past few days.  Reports being seen at a walk-in clinic yesterday and tested negative for strep and COVID.  Reports developing a sharp stabbing pain in his left ear earlier today.  Denies any discharge or drainage.  Denies any trauma, injury, or other precipitating event.  Denies any specific alleviating or aggravating factors.  Denies any fevers, chest pain, shortness of breath, N/V/D, numbness, tingling, weakness, abdominal pain, or headaches.    The history is provided by the patient.  Sore Throat  Cough Associated symptoms: ear pain and sore throat   Otalgia Associated symptoms: cough and sore throat    History reviewed. No pertinent past medical history.  There are no problems to display for this patient.   Past Surgical History:  Procedure Laterality Date   wisdom tooth removal      OB History   No obstetric history on file.      Home Medications    Prior to Admission medications   Medication Sig Start Date End Date Taking? Authorizing Provider  amoxicillin (AMOXIL) 500 MG capsule Take 1 capsule (500 mg total) by mouth 2 (two) times daily for 10 days. 06/26/21 07/06/21 Yes Ivette Loyal, NP  ibuprofen (ADVIL) 600 MG tablet Take 1 tablet (600 mg total) by mouth every 6 (six) hours as needed. 08/17/20   Merrilee Jansky, MD  nortriptyline (PAMELOR) 25 MG capsule Take 1 capsule by mouth at bedtime 02/26/21   Drema Dallas, DO  testosterone cypionate (DEPOTESTOSTERONE CYPIONATE) 200 MG/ML injection Inject into the muscle. injectr 3.76ml once a week    [provider]  vitamin B-12 (CYANOCOBALAMIN) 500 MCG tablet Take 500 mcg by mouth daily. Patient  not taking: Reported on 01/18/2021    [provider]    Family History Family History  Problem Relation Age of Onset   Healthy Mother    Healthy Father     Social History Social History   Tobacco Use   Smoking status: Never   Smokeless tobacco: Never  Vaping Use   Vaping Use: Never used  Substance Use Topics   Alcohol use: No   Drug use: No     Allergies   Patient has no known allergies.   Review of Systems Review of Systems  Constitutional:  Positive for fatigue.  HENT:  Positive for ear pain and sore throat.   Respiratory:  Positive for cough.   All other systems reviewed and are negative.   Physical Exam Triage Vital Signs ED Triage Vitals  Enc Vitals Group     BP 06/26/21 1919 114/84     Pulse Rate 06/26/21 1919 97     Resp 06/26/21 1919 20     Temp 06/26/21 1919 99 F (37.2 C)     Temp Source 06/26/21 1919 Oral     SpO2 06/26/21 1919 90 %     Weight --      Height --      Head Circumference --      Peak Flow --      Pain Score 06/26/21 1917 7  Pain Loc --      Pain Edu? --      Excl. in GC? --    No data found.  Updated Vital Signs BP 114/84 (BP Location: Right Arm)   Pulse 97   Temp 99 F (37.2 C) (Oral)   Resp 20   LMP  (LMP Unknown)   SpO2 90%   Visual Acuity Right Eye Distance:   Left Eye Distance:   Bilateral Distance:    Right Eye Near:   Left Eye Near:    Bilateral Near:     Physical Exam Vitals and nursing note reviewed.  Constitutional:      General: He is not in acute distress.    Appearance: Normal appearance. He is not ill-appearing, toxic-appearing or diaphoretic.  HENT:     Head: Normocephalic and atraumatic.     Right Ear: Hearing, ear canal and external ear normal. Tenderness present.     Left Ear: Decreased hearing noted. There is mastoid tenderness. Tympanic membrane is injected, erythematous and bulging.     Mouth/Throat:     Pharynx: Uvula midline. Pharyngeal swelling and posterior oropharyngeal  erythema present.     Tonsils: No tonsillar exudate or tonsillar abscesses. 2+ on the right. 2+ on the left.  Eyes:     Conjunctiva/sclera: Conjunctivae normal.  Cardiovascular:     Rate and Rhythm: Normal rate.     Pulses: Normal pulses.  Pulmonary:     Effort: Pulmonary effort is normal.  Abdominal:     General: Abdomen is flat.  Musculoskeletal:        General: Normal range of motion.     Cervical back: Normal range of motion.  Skin:    General: Skin is warm and dry.  Neurological:     General: No focal deficit present.     Mental Status: He is alert and oriented to person, place, and time.  Psychiatric:        Mood and Affect: Mood normal.     UC Treatments / Results  Labs (all labs ordered are listed, but only abnormal results are displayed) Labs Reviewed - No data to display  EKG   Radiology No results found.  Procedures Procedures (including critical care time)  Medications Ordered in UC Medications - No data to display  Initial Impression / Assessment and Plan / UC Course  I have reviewed the triage vital signs and the nursing notes.  Pertinent labs & imaging results that were available during my care of the patient were reviewed by me and considered in my medical decision making (see chart for details).    Assessment negative for red flags or concerns.  Otitis media of the left ear and pharyngitis.  Will treat with amoxicillin for the next 10 days.  Tylenol and ibuprofen as needed for pain and fever.  Follow-up as needed Final Clinical Impressions(s) / UC Diagnoses   Final diagnoses:  Acute suppurative otitis media of left ear without spontaneous rupture of tympanic membrane, recurrence not specified  Viral pharyngitis     Discharge Instructions      Take the amoxicillin twice a day for the next 10 days.    You can take Tylenol and/or Ibuprofen as needed for pain relief and fever reduction.   Return or go to the Emergency Department if symptoms  worsen or do not improve in the next few days.     ED Prescriptions     Medication Sig Dispense Auth. Provider   amoxicillin (AMOXIL) 500  MG capsule Take 1 capsule (500 mg total) by mouth 2 (two) times daily for 10 days. 20 capsule Ivette Loyal, NP      PDMP not reviewed this encounter.   Ivette Loyal, NP 06/26/21 2009

## 2021-07-03 DIAGNOSIS — F640 Transsexualism: Secondary | ICD-10-CM | POA: Diagnosis not present

## 2021-07-03 DIAGNOSIS — Z79890 Hormone replacement therapy (postmenopausal): Secondary | ICD-10-CM | POA: Diagnosis not present

## 2021-08-01 NOTE — Progress Notes (Signed)
NEUROLOGY FOLLOW UP OFFICE NOTE  Yvonne Oneill [id number]  Assessment/Plan:   Headache Chronic pain syndrome  Nortriptyline 25mg  at bedtime Biofreeze or Tylenol or Advil as needed for headache rescue Limit use of pain relievers to no more than 2 days out of week to prevent risk of rebound or medication-overuse headache. Keep headache diary Follow up in May   Subjective:  Yvonne Oneill is a 20 year old left-handed transgender female who follows up for headache, bilateral upper and lower extremity pain and weakness, episodic bilateral vision loss and chronic fatigue.   UPDATE: Intensity:  severe Duration:  1 to 2 hours Frequency:  1-2 in last 30 days Current NSAIDs/analgesics:  Advil Current antidepressant:  Nortriptyline 25mg  Current vitamins/supplements:   iron 65mg  daily Current steroids:  testosterone shots Treats with Biofreeze and Tylenol or Advil   HISTORY: Headache: Onset:  2nd grade.  Mild right temporal squeezing pain.  No associated nausea, vomiting, photophobia, phonophobia or visual disturbance.  Lasts a couple of hours.  Headache frequency varies from 5 to 6 in a month or every 2 months.  Advil and Biofreeze helps.  Ice pack is ineffective.     Upper And Lower Extremity Pain: Onset:  3 years ago.  He reports sharp electric or twitching pain lasting seconds to minutes in the arms and legs that occur daily, mostly in left arm and right leg.  Rarely, the pain lasts up to several hours.  It was initially intermittent, occurring every couple of weeks.  Then it is daily.  Left leg goes numb frequently.  Generalized weakness but no focal weakness.  CK and Mg from 03/03/2020 were 88 and 2.1 respectively.  MRI of brain and cervical spine with and without contrast on 04/04/2020 personally reviewed was unremarkable.  Incidentally, cervical spine did show mild spondylosis without neural impingement.  NCV-EMG which was normal.     Episodic Bilateral Vision Loss: Onset:   Third grade.  It lasted 15 seconds.  Complete black out of vision but feels eyes moving.  Lasts several seconds.  No loss of consciousness.  Occurs once every one or two weeks.  Occurs separate from his headaches.  Ophthalmology evaluation was unremarkable.   Memory deficits: Reports short term memory deficits over the past 5 months.  He is a at 03/05/2020.  He is doing well in school.    Others: Since October, he coughs after eating. Fatigue Sometimes his left hand shakes    Past medications for depression:  Zoloft; Lexapro   Family History:  Maternal grandmother (fibromyalgia)  PAST MEDICAL HISTORY: No past medical history on file.  MEDICATIONS: Current Outpatient Medications on File Prior to Visit  Medication Sig Dispense Refill   ibuprofen (ADVIL) 600 MG tablet Take 1 tablet (600 mg total) by mouth every 6 (six) hours as needed. 30 tablet 0   nortriptyline (PAMELOR) 25 MG capsule Take 1 capsule by mouth at bedtime 90 capsule 1   testosterone cypionate (DEPOTESTOSTERONE CYPIONATE) 200 MG/ML injection Inject into the muscle. injectr 3.20ml once a week     vitamin B-12 (CYANOCOBALAMIN) 500 MCG tablet Take 500 mcg by mouth daily. (Patient not taking: Reported on 01/18/2021)     No current facility-administered medications on file prior to visit.    ALLERGIES: No Known Allergies  FAMILY HISTORY: Family History  Problem Relation Age of Onset   Healthy Mother    Healthy Father       Objective:  Blood pressure  105/67, pulse (!) 132, height 5\' 5"  (1.651 m), weight 112 lb (50.8 kg), SpO2 97 %. General: No acute distress.  Patient appears well-groomed.   Head:  Normocephalic/atraumatic Eyes:  Fundi examined but not visualized Neck: supple, no paraspinal tenderness, full range of motion Heart:  Regular rate and rhythm Lungs:  Clear to auscultation bilaterally Back: No paraspinal tenderness Neurological Exam: alert and oriented to person, place, and  time.  Speech fluent and not dysarthric, language intact.  CN II-XII intact. Bulk and tone normal, muscle strength 5/5 throughout.  Sensation to light touch intact.  Deep tendon reflexes 2+ throughout, toes downgoing.  Finger to nose testing intact.  Gait normal, Romberg negative.   , DO  CC: Shon Millet, PA

## 2021-08-02 ENCOUNTER — Encounter: Payer: Self-pay | Admitting: Neurology

## 2021-08-02 ENCOUNTER — Ambulatory Visit (INDEPENDENT_AMBULATORY_CARE_PROVIDER_SITE_OTHER): Payer: BC Managed Care – PPO | Admitting: Neurology

## 2021-08-02 ENCOUNTER — Other Ambulatory Visit: Payer: Self-pay

## 2021-08-02 VITALS — BP 105/67 | HR 132 | Ht 65.0 in | Wt 112.0 lb

## 2021-08-02 DIAGNOSIS — G4489 Other headache syndrome: Secondary | ICD-10-CM

## 2021-08-02 DIAGNOSIS — G894 Chronic pain syndrome: Secondary | ICD-10-CM | POA: Diagnosis not present

## 2021-08-02 NOTE — Patient Instructions (Signed)
Continue nortriptyline 25mg  at bedtime Advil or Tylenol with Biofreeze Follow up in May

## 2021-09-10 DIAGNOSIS — Z23 Encounter for immunization: Secondary | ICD-10-CM | POA: Diagnosis not present

## 2021-09-10 DIAGNOSIS — R300 Dysuria: Secondary | ICD-10-CM | POA: Diagnosis not present

## 2021-09-28 ENCOUNTER — Encounter: Payer: Self-pay | Admitting: Neurology

## 2021-09-29 ENCOUNTER — Other Ambulatory Visit: Payer: Self-pay | Admitting: Neurology

## 2022-01-01 DIAGNOSIS — F640 Transsexualism: Secondary | ICD-10-CM | POA: Diagnosis not present

## 2022-01-01 DIAGNOSIS — Z79890 Hormone replacement therapy (postmenopausal): Secondary | ICD-10-CM | POA: Diagnosis not present

## 2022-01-16 ENCOUNTER — Other Ambulatory Visit: Payer: Self-pay | Admitting: Neurology

## 2022-02-22 IMAGING — MR MR CERVICAL SPINE WO/W CM
5 of 8 series · 25 of 48 positions shown · IV contrast (multihance)
Comparison: None.

CLINICAL DATA: Multiple sclerosis. Blurry vision, fatigue, muscle
spasms, and twitching in the arms and legs. Worsening symptoms in
the past 6 months.

EXAM:
MRI HEAD WITHOUT AND WITH CONTRAST
MRI CERVICAL SPINE WITHOUT AND WITH CONTRAST
TECHNIQUE: Multiplanar, multiecho pulse sequences of the brain and surrounding
structures, and cervical spine, to include the craniocervical
junction and cervicothoracic junction, were obtained without and
with intravenous contrast.
CONTRAST:  11mL MULTIHANCE GADOBENATE DIMEGLUMINE 529 MG/ML IV SOLN

[Series 13: T1 · sagittal · 3.0mm · 0.41mm/px · 4 of 13 slices shown (1 of 2)]
[im 1/13]
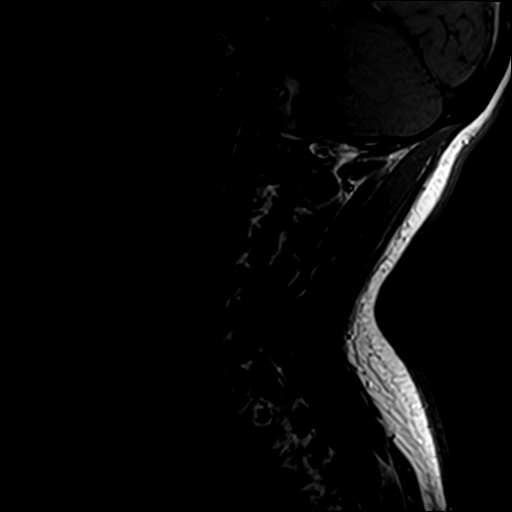
[im 5/13]
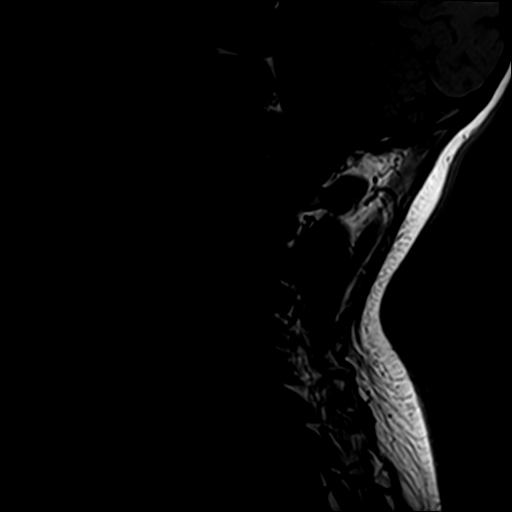
[im 9/13]
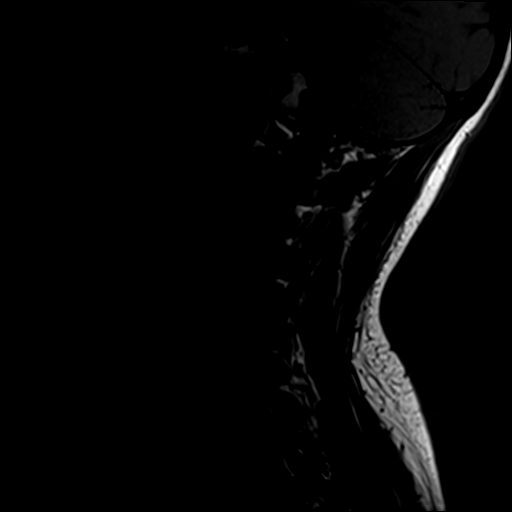
[im 13/13]
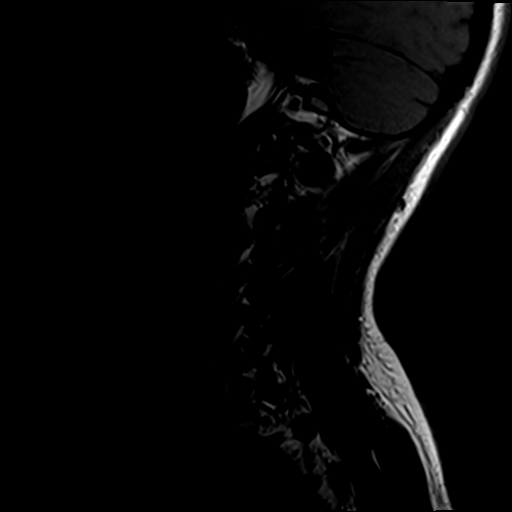

[Series 15: T2 · axial · 3.0mm · 0.70mm/px · z∈[-203,-111]mm · 8 of 25 slices shown (1 of 2)]
[im 1/25]
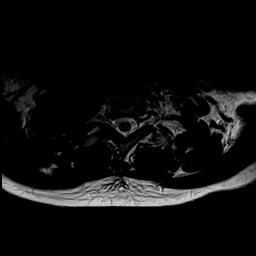
[im 4/25]
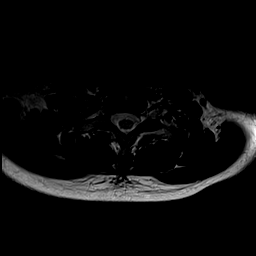
[im 7/25]
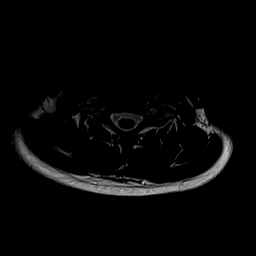
[im 11/25]
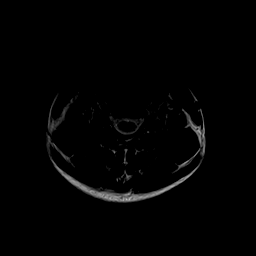
[im 14/25]
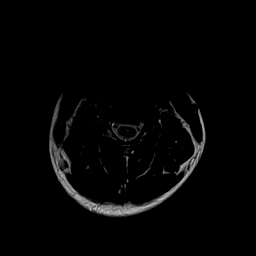
[im 18/25]
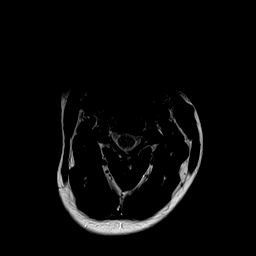
[im 21/25]
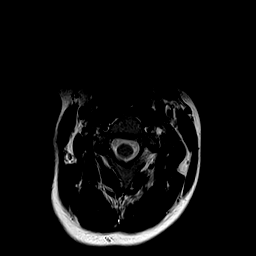
[im 25/25]
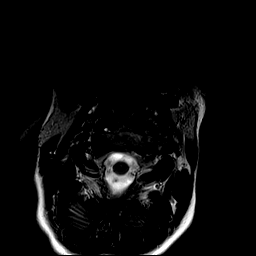

[Series 16: T1 · axial · 3.0mm · 0.35mm/px · z∈[-203,-111]mm · 8 of 26 slices shown (2 of 2)]
[im 1/26]
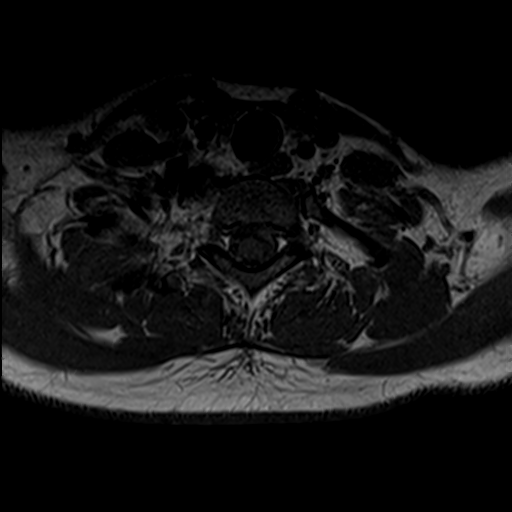
[im 4/26]
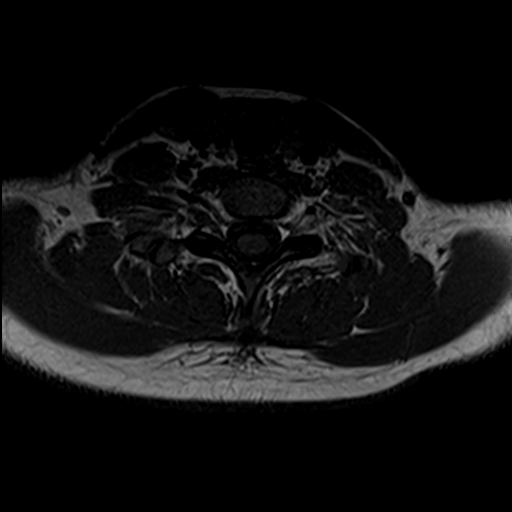
[im 8/26]
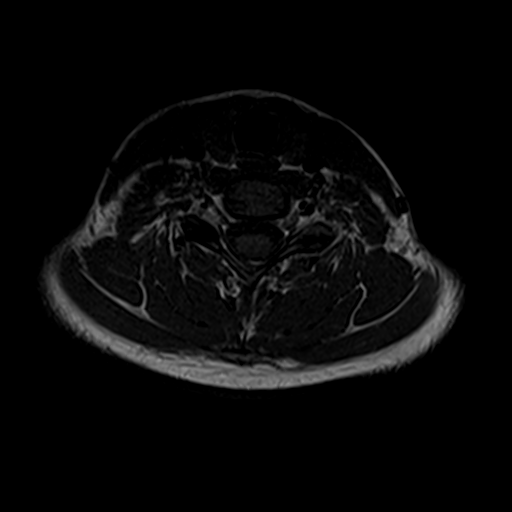
[im 11/26]
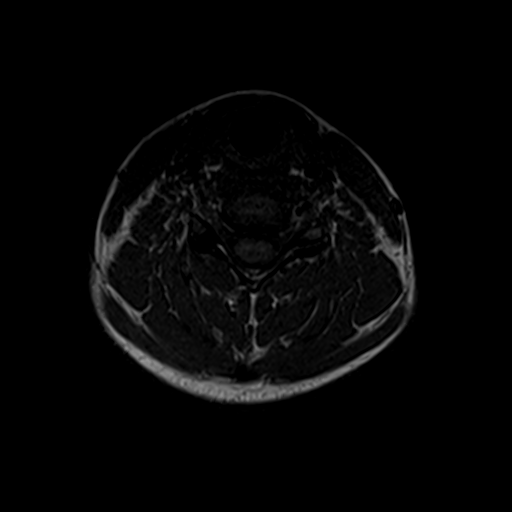
[im 15/26]
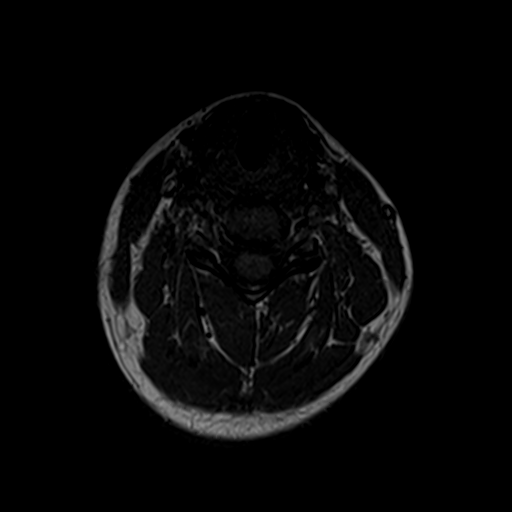
[im 18/26]
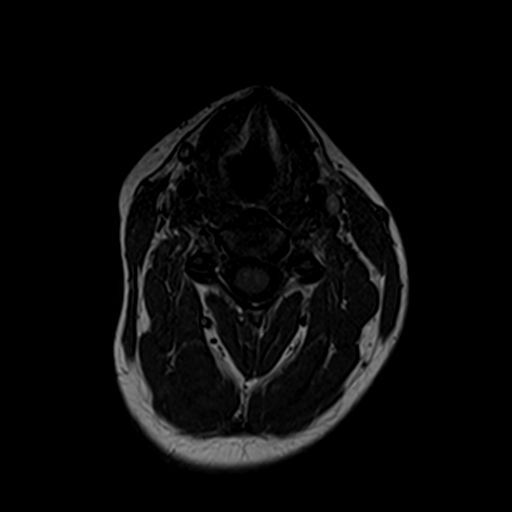
[im 22/26]
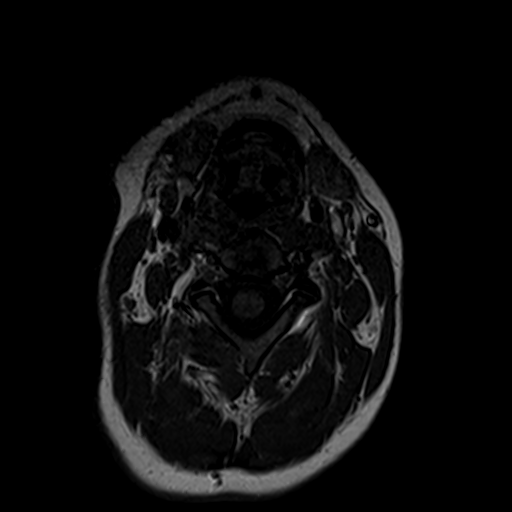
[im 26/26]
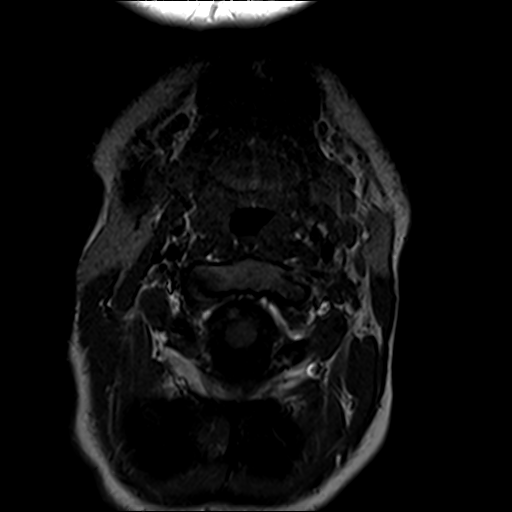

[Series 17: T2 · sagittal · 3.0mm · 0.41mm/px · 4 of 13 slices shown (2 of 2)]
[im 1/13]
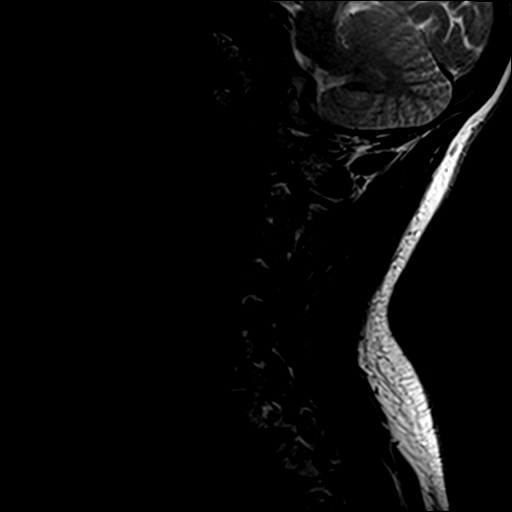
[im 5/13]
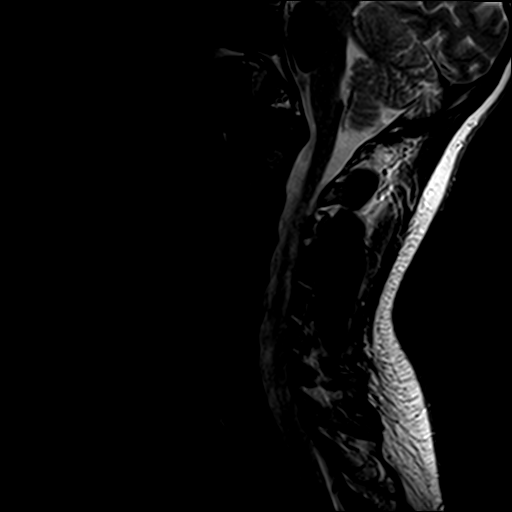
[im 9/13]
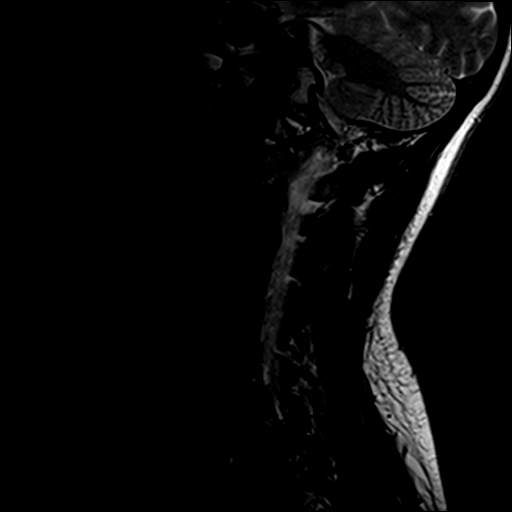
[im 13/13]
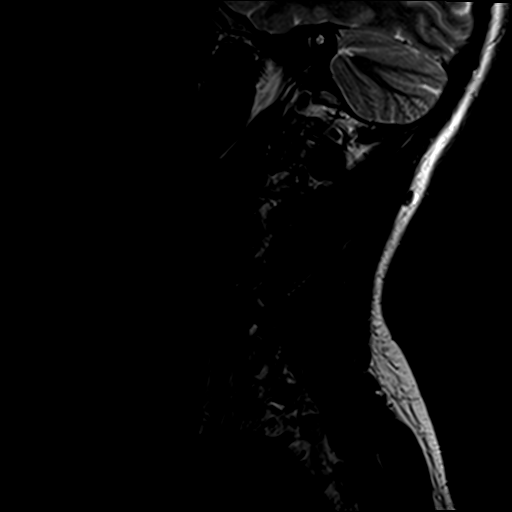

[Series 18: T1 fat-sat post-contrast · sagittal · 3.0mm · 0.82mm/px · 1 of 13 slices shown]
[im 1/13]
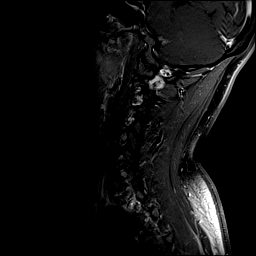

[25 of 48 positions shown; findings below may reference images not displayed]

FINDINGS: MRI HEAD FINDINGS

Brain: There is no evidence of acute infarct, intracranial
hemorrhage, mass, midline shift, or extra-axial fluid collection.
The ventricles and sulci are normal. The brain is normal in signal.
No abnormal enhancement is identified.

Vascular: Major intracranial vascular flow voids are preserved.

Skull and upper cervical spine: Unremarkable bone marrow signal para

Sinuses/Orbits: Unremarkable orbits. Paranasal sinuses and mastoid
air cells are clear.

Other: None.

MRI CERVICAL SPINE FINDINGS

Alignment: Normal.

Vertebrae: No fracture, suspicious osseous lesion, or significant
marrow edema.

Cord: Normal signal and morphology. No abnormal enhancement.

Posterior Fossa, vertebral arteries, paraspinal tissues:
Unremarkable.

Disc levels:

C2-3: Negative.

C3-4: Asymmetric left uncovertebral spurring results in borderline
left neural foraminal stenosis without spinal stenosis.

C4-5: Minimal disc bulging and uncovertebral spurring without
stenosis.

C5-6: Minimal disc bulging and uncovertebral spurring without
stenosis.

C6-7: Negative.

C7-T1: Negative.
IMPRESSION: 1. Unremarkable appearance of the brain and cervical spinal cord. No
evidence of demyelinating disease.
2. Mild cervical spondylosis without evidence of neural impingement.

## 2022-03-04 DIAGNOSIS — Z79890 Hormone replacement therapy (postmenopausal): Secondary | ICD-10-CM | POA: Diagnosis not present

## 2022-03-04 DIAGNOSIS — F640 Transsexualism: Secondary | ICD-10-CM | POA: Diagnosis not present

## 2022-03-04 DIAGNOSIS — Z0000 Encounter for general adult medical examination without abnormal findings: Secondary | ICD-10-CM | POA: Diagnosis not present

## 2022-03-04 DIAGNOSIS — Z8249 Family history of ischemic heart disease and other diseases of the circulatory system: Secondary | ICD-10-CM | POA: Diagnosis not present

## 2022-04-01 ENCOUNTER — Ambulatory Visit: Payer: BC Managed Care – PPO | Admitting: Neurology

## 2022-04-19 ENCOUNTER — Ambulatory Visit: Payer: BC Managed Care – PPO | Admitting: Neurology

## 2022-05-06 DIAGNOSIS — J069 Acute upper respiratory infection, unspecified: Secondary | ICD-10-CM | POA: Diagnosis not present

## 2022-05-11 ENCOUNTER — Encounter (HOSPITAL_COMMUNITY): Payer: Self-pay | Admitting: *Deleted

## 2022-05-11 ENCOUNTER — Ambulatory Visit (HOSPITAL_COMMUNITY)
Admission: EM | Admit: 2022-05-11 | Discharge: 2022-05-11 | Disposition: A | Discharge status: Home / Self Care | Payer: BC Managed Care – PPO | Attending: Emergency Medicine | Admitting: Emergency Medicine

## 2022-05-11 ENCOUNTER — Other Ambulatory Visit: Payer: Self-pay

## 2022-05-11 ENCOUNTER — Ambulatory Visit (INDEPENDENT_AMBULATORY_CARE_PROVIDER_SITE_OTHER): Payer: BC Managed Care – PPO

## 2022-05-11 DIAGNOSIS — R059 Cough, unspecified: Secondary | ICD-10-CM | POA: Diagnosis not present

## 2022-05-11 DIAGNOSIS — R051 Acute cough: Secondary | ICD-10-CM

## 2022-05-11 DIAGNOSIS — R079 Chest pain, unspecified: Secondary | ICD-10-CM | POA: Diagnosis not present

## 2022-05-11 LAB — POC URINE PREG, ED: Preg Test, Ur: NEGATIVE

## 2022-05-11 MED ORDER — IBUPROFEN 800 MG PO TABS
800.0000 mg | ORAL_TABLET | Freq: Once | ORAL | Status: AC
  Administered 2022-05-11: 800 mg via ORAL

## 2022-05-11 MED ORDER — BENZONATATE 100 MG PO CAPS: 100.0000 mg | ORAL_CAPSULE | Freq: Three times a day (TID) | ORAL | 0 refills | Status: AC

## 2022-05-11 MED ORDER — IBUPROFEN 800 MG PO TABS
ORAL_TABLET | ORAL | Status: AC
  Filled 2022-05-11: qty 1

## 2022-05-11 NOTE — ED Triage Notes (Signed)
Pt reports on going cough that is productive at times. Pt also has muscle pain from coughing.

## 2022-05-11 NOTE — ED Provider Notes (Signed)
[facility]    CSN: [id number] Arrival date & time: 05/11/22  1015     History   Chief Complaint Chief Complaint  Patient presents with   Cough   Muscle Pain    HPI Yvonne Oneill is a 21 y.o. adult.  Presents with 2-week history of cough.  Reports somewhat productive of clear mucus.  Has been persistent even with the use of nasal spray and cough medicine. Denies any fever, chills, congestion, sore throat, shortness of breath or trouble breathing, abdominal pain, vomiting/diarrhea, rash. Reports muscular chest wall pain from the persistent cough.  Has not tried any anti-inflammatories for the symptoms.  Was taking aspirin because he was worried about the cause being cardiac. Denies any history of heart or lung problems.  Denies any lower leg swelling or pain.  No recent hospitalization or immobilization. No long car or plane rides. Does take testosterone therapy.  Patient does have uterus and ovaries, does not have menstrual cycle due to testosterone therapy.  Denies possibility of pregnancy.  History reviewed. No pertinent past medical history.  There are no problems to display for this patient.   Past Surgical History:  Procedure Laterality Date   wisdom tooth removal      OB History   No obstetric history on file.      Home Medications    Prior to Admission medications   Medication Sig Start Date End Date Taking? Authorizing Provider  benzonatate (TESSALON) 100 MG capsule Take 1 capsule (100 mg total) by mouth 3 (three) times daily. 05/11/22  Yes Ameir Faria, Lurena Joiner, PA-C  ibuprofen (ADVIL) 600 MG tablet Take 1 tablet (600 mg total) by mouth every 6 (six) hours as needed. 08/17/20   Merrilee Jansky, MD  nortriptyline (PAMELOR) 25 MG capsule Take 1 capsule by mouth at bedtime 01/17/22   Drema Dallas, DO  testosterone cypionate (DEPOTESTOSTERONE CYPIONATE) 200 MG/ML injection Inject into the muscle. injectr 3.1ml once a week    [provider]   vitamin B-12 (CYANOCOBALAMIN) 500 MCG tablet Take 500 mcg by mouth daily. Patient not taking: No sig reported    [provider]    Family History Family History  Problem Relation Age of Onset   Healthy Mother    Healthy Father     Social History Social History   Tobacco Use   Smoking status: Never   Smokeless tobacco: Never  Vaping Use   Vaping Use: Never used  Substance Use Topics   Alcohol use: No   Drug use: No     Allergies   Patient has no known allergies.   Review of Systems Review of Systems  Respiratory:  Positive for cough.    Per HPI  Physical Exam Triage Vital Signs ED Triage Vitals  Enc Vitals Group     BP 05/11/22 1059 104/62     Pulse Rate 05/11/22 1059 96     Resp 05/11/22 1059 18     Temp 05/11/22 1059 98.5 F (36.9 C)     Temp src --      SpO2 05/11/22 1059 98 %     Weight --      Height --      Head Circumference --      Peak Flow --      Pain Score 05/11/22 1057 6     Pain Loc --      Pain Edu? --      Excl. in GC? --    No  data found.  Updated Vital Signs BP 104/62   Pulse 96   Temp 98.5 F (36.9 C)   Resp 18   SpO2 98%    Physical Exam Vitals and nursing note reviewed.  Constitutional:      General: He is not in acute distress. HENT:     Nose: Nose normal. No congestion.     Mouth/Throat:     Mouth: Mucous membranes are moist.     Pharynx: Uvula midline.     Tonsils: No tonsillar exudate or tonsillar abscesses.  Eyes:     Conjunctiva/sclera: Conjunctivae normal.  Cardiovascular:     Rate and Rhythm: Normal rate and regular rhythm.     Pulses: Normal pulses.     Heart sounds: Normal heart sounds.  Pulmonary:     Effort: Pulmonary effort is normal. No respiratory distress.     Breath sounds: Normal breath sounds. No wheezing, rhonchi or rales.  Abdominal:     Palpations: Abdomen is soft.     Tenderness: There is no abdominal tenderness.  Musculoskeletal:        General: Normal range of motion.      Cervical back: Normal range of motion.  Lymphadenopathy:     Cervical: No cervical adenopathy.  Skin:    General: Skin is warm and dry.  Neurological:     Mental Status: He is alert and oriented to person, place, and time.      UC Treatments / Results  Labs (all labs ordered are listed, but only abnormal results are displayed) Labs Reviewed  POC URINE PREG, ED    EKG  Radiology DG Chest 2 View  Result Date: 05/11/2022 CLINICAL DATA:  Pt reports on going cough that is productive at times. Pt also has muscle pain from coughing EXAM: CHEST - 2 VIEW COMPARISON:  None Available. FINDINGS: Normal heart, mediastinum and hila. Clear lungs.  No pleural effusion or pneumothorax. Skeletal structures are within normal limits. IMPRESSION: Normal chest radiographs. Electronically Signed   By: Amie Portland M.D.   On: 05/11/2022 12:21    Procedures Procedures   Medications Ordered in UC Medications  ibuprofen (ADVIL) tablet 800 mg (800 mg Oral Given 05/11/22 1151)    Initial Impression / Assessment and Plan / UC Course  I have reviewed the triage vital signs and the nursing notes.  Pertinent labs & imaging results that were available during my care of the patient were reviewed by me and considered in my medical decision making (see chart for details).  Vitals are stable, afebrile and not tachycardic.  Appears well with no increased work of breathing.  Lungs are clear to auscultation. Urine pregnancy negative. Ibuprofen dose given with some improvement of chest wall pain. Reassuring.   Chest x-ray negative.  Benzonatate 3x daily. Follow up with primary care for persistent symptoms. Emergency department for any worsening symptoms. Return precautions discussed. Patient agrees to plan and is discharged in stable condition.  Final Clinical Impressions(s) / UC Diagnoses   Final diagnoses:  Acute cough     Discharge Instructions      You can try the cough medicine 3 times daily.  If  this medicine makes you drowsy, please take only at nighttime.  I do recommend following up with your primary care provider if symptoms are persistent.  If anything gets worse please go to the emergency department.     ED Prescriptions     Medication Sig Dispense Auth. Provider   benzonatate (TESSALON) 100 MG capsule  Take 1 capsule (100 mg total) by mouth 3 (three) times daily. 21 capsule Ronnetta Currington, Lurena Joiner, PA-C      PDMP not reviewed this encounter.   Averee Harb, Lurena Joiner, [place] 05/11/22 1245

## 2022-05-11 NOTE — Discharge Instructions (Addendum)
You can try the cough medicine 3 times daily.  If this medicine makes you drowsy, please take only at nighttime.  I do recommend following up with your primary care provider if symptoms are persistent.  If anything gets worse please go to the emergency department.

## 2022-05-12 ENCOUNTER — Other Ambulatory Visit: Payer: Self-pay

## 2022-05-12 ENCOUNTER — Emergency Department (HOSPITAL_COMMUNITY)
Admission: EM | Admit: 2022-05-12 | Discharge: 2022-05-12 | Disposition: A | Discharge status: Home / Self Care | Payer: BC Managed Care – PPO | Attending: Emergency Medicine | Admitting: Emergency Medicine

## 2022-05-12 DIAGNOSIS — R059 Cough, unspecified: Secondary | ICD-10-CM | POA: Insufficient documentation

## 2022-05-12 DIAGNOSIS — R0789 Other chest pain: Secondary | ICD-10-CM | POA: Diagnosis not present

## 2022-05-12 DIAGNOSIS — R7309 Other abnormal glucose: Secondary | ICD-10-CM | POA: Insufficient documentation

## 2022-05-12 DIAGNOSIS — R519 Headache, unspecified: Secondary | ICD-10-CM | POA: Insufficient documentation

## 2022-05-12 DIAGNOSIS — R079 Chest pain, unspecified: Secondary | ICD-10-CM | POA: Insufficient documentation

## 2022-05-12 DIAGNOSIS — R112 Nausea with vomiting, unspecified: Secondary | ICD-10-CM

## 2022-05-12 LAB — BASIC METABOLIC PANEL
Anion gap: 9 (ref 5–15)
BUN: 7 mg/dL (ref 6–20)
CO2: 22 mmol/L (ref 22–32)
Calcium: 8.8 mg/dL — ABNORMAL LOW (ref 8.9–10.3)
Chloride: 106 mmol/L (ref 98–111)
Creatinine, Ser: 0.88 mg/dL (ref 0.44–1.00)
GFR, Estimated: >60 mL/min (ref >60)
Glucose, Bld: 155 mg/dL — ABNORMAL HIGH (ref 70–99)
Potassium: 3.9 mmol/L (ref 3.5–5.1)
Sodium: 137 mmol/L (ref 135–145)

## 2022-05-12 LAB — CBC
HCT: 42 % (ref 36.0–46.0)
Hemoglobin: 13.5 g/dL (ref 12.0–15.0)
MCH: 31.3 pg (ref 26.0–34.0)
MCHC: 32.1 g/dL (ref 30.0–36.0)
MCV: 97.2 fL (ref 80.0–100.0)
Platelets: 223 10*3/uL (ref 150–400)
RBC: 4.32 MIL/uL (ref 3.87–5.11)
RDW: 11.9 % (ref 11.5–15.5)
WBC: 6.5 10*3/uL (ref 4.0–10.5)
nRBC: 0 % (ref 0.0–0.2)

## 2022-05-12 LAB — I-STAT BETA HCG BLOOD, ED (MC, WL, AP ONLY): I-stat hCG, quantitative: <5 m[IU]/mL (ref <5)

## 2022-05-12 LAB — D-DIMER, QUANTITATIVE: D-Dimer, Quant: <0.27 ug/mL-FEU (ref 0.00–0.50)

## 2022-05-12 LAB — TROPONIN I (HIGH SENSITIVITY): Troponin I (High Sensitivity): <2 ng/L (ref <18)

## 2022-05-12 NOTE — Discharge Instructions (Addendum)
Please return to the ED with any new or worsening symptoms Please read attached guide concerning nausea in adults Please follow-up with your PCP for ongoing management

## 2022-05-12 NOTE — ED Provider Notes (Signed)
[facility] EMERGENCY DEPARTMENT Provider Note   CSN: [id number] Arrival date & time: 05/12/22  1754     History  Chief Complaint  Patient presents with   Cough   Chest Pain    Yvonne Oneill is a 21 y.o. adult with no relevant medical history.  The patient presents to ED for evaluation after episode of coughing and emesis.  Patient reports that for the last 1 week she has had centralized chest pain along with cough.  Patient was seen in urgent care 1 day earlier, had unremarkable lab work-up and chest x-ray.  Patient was discharged home with [facility].  Patient states that yesterday chest pain is reproducible on palpation however today it is not.  Patient states she believes her symptoms are improving that brought her to urgent care day prior.  Patient presents today due to 3 episodes of vomiting that occurred after she ingested an alcoholic beverage, her friend made her laugh and she choked.  The patient believes that the liquid "went down the wrong pipe".  Patient denies any fevers, abdominal pain, diarrhea, lightheadedness, weakness, shortness of breath.  Patient does endorse headache however states that these headaches are normal for her, there are no new qualities or features to these headaches.   Cough Associated symptoms: chest pain and headaches   Associated symptoms: no fever and no shortness of breath   Chest Pain Associated symptoms: cough, headache and vomiting   Associated symptoms: no abdominal pain, no fever, no nausea, no shortness of breath and no weakness        Home Medications Prior to Admission medications   Medication Sig Start Date End Date Taking? Authorizing Provider  benzonatate (TESSALON) 100 MG capsule Take 1 capsule (100 mg total) by mouth 3 (three) times daily. 05/11/22   Rising, Lurena Joiner, PA-C  ibuprofen (ADVIL) 600 MG tablet Take 1 tablet (600 mg total) by mouth every 6 (six) hours as needed. 08/17/20   Merrilee Jansky, MD   nortriptyline (PAMELOR) 25 MG capsule Take 1 capsule by mouth at bedtime 01/17/22   Drema Dallas, DO  testosterone cypionate (DEPOTESTOSTERONE CYPIONATE) 200 MG/ML injection Inject into the muscle. injectr 3.56ml once a week    [provider]  vitamin B-12 (CYANOCOBALAMIN) 500 MCG tablet Take 500 mcg by mouth daily. Patient not taking: No sig reported    [provider]      Allergies    Patient has no known allergies.    Review of Systems   Review of Systems  Constitutional:  Negative for fever.  Respiratory:  Positive for cough. Negative for shortness of breath.   Cardiovascular:  Positive for chest pain.  Gastrointestinal:  Positive for vomiting. Negative for abdominal pain, diarrhea and nausea.  Genitourinary:  Negative for dysuria.  Neurological:  Positive for headaches. Negative for weakness and light-headedness.  All other systems reviewed and are negative.   Physical Exam Updated Vital Signs BP 115/82   Pulse 98   Temp 98.3 F (36.8 C) (Oral)   Resp 17   SpO2 99%  Physical Exam Vitals and nursing note reviewed.  Constitutional:      General: He is not in acute distress.    Appearance: He is well-developed. He is not ill-appearing, toxic-appearing or diaphoretic.  HENT:     Head: Normocephalic and atraumatic.  Eyes:     Extraocular Movements: Extraocular movements intact.     Pupils: Pupils are equal, round, and reactive to light.  Cardiovascular:  Rate and Rhythm: Normal rate and regular rhythm.  Pulmonary:     Effort: Pulmonary effort is normal.     Breath sounds: Normal breath sounds. No decreased breath sounds, wheezing, rhonchi or rales.  Chest:     Chest wall: No tenderness or crepitus.  Abdominal:     General: Bowel sounds are normal.     Palpations: Abdomen is soft.     Tenderness: There is no abdominal tenderness.  Musculoskeletal:     Right lower leg: No tenderness. No edema.     Left lower leg: No tenderness. No edema.   Skin:    General: Skin is warm and dry.     Capillary Refill: Capillary refill takes less than 2 seconds.  Neurological:     General: No focal deficit present.     Mental Status: He is alert and oriented to person, place, and time.     GCS: GCS eye subscore is 4. GCS verbal subscore is 5. GCS motor subscore is 6.     Cranial Nerves: Cranial nerves 2-12 are intact. No cranial nerve deficit.     Sensory: Sensation is intact. No sensory deficit.     Motor: Motor function is intact. No weakness.     Coordination: Coordination is intact. Heel to Shin Test normal.     ED Results / Procedures / Treatments   Labs (all labs ordered are listed, but only abnormal results are displayed) Labs Reviewed  BASIC METABOLIC PANEL - Abnormal; Notable for the following components:      Result Value   Glucose, Bld 155 (*)    Calcium 8.8 (*)    All other components within normal limits  CBC  D-DIMER, QUANTITATIVE  I-STAT BETA HCG BLOOD, ED ([facility], [facility], [facility] ONLY)  TROPONIN I (HIGH SENSITIVITY)    EKG None  Radiology DG Chest 2 View  Result Date: 05/11/2022 CLINICAL DATA:  Pt reports on going cough that is productive at times. Pt also has muscle pain from coughing EXAM: CHEST - 2 VIEW COMPARISON:  None Available. FINDINGS: Normal heart, mediastinum and hila. Clear lungs.  No pleural effusion or pneumothorax. Skeletal structures are within normal limits. IMPRESSION: Normal chest radiographs. Electronically Signed   By: Amie Portland M.D.   On: 05/11/2022 12:21    Procedures Procedures   Medications Ordered in ED Medications - No data to display  ED Course/ Medical Decision Making/ A&P                           Medical Decision Making Amount and/or Complexity of Data Reviewed Labs: ordered.  21 year old female presents to ED for evaluation.  Please see HPI for further details.  On examination, patient afebrile, nontachycardic.  Patient lung sounds clear bilaterally, not hypoxic on room air.  The  patient's abdomen is soft and compressible all 4 quadrants.  Patient neurological examination shows no focal neurodeficits.  Patient worked up utilizing the following labs and imaging studies interpreted by me personally: - D-dimer not elevated, patient cannot be PERCd out due to the fact he takes testosterone replacement - Pregnancy test negative - Troponin less than 2, patient denies any chest pain, will not receive delta troponin - CBC unremarkable - BMP shows elevated glucose 155 - EKG nonischemic  At this time, patient denies any active chest pain.  Patient states that she feels fine.  The patient be discharged home and advised to follow-up with her PCP for any ongoing needs.  The patient was given return precautions to include fevers, productive cough.  She voices understanding of these.  Patient is stable for discharge home.    Final Clinical Impression(s) / ED Diagnoses Final diagnoses:  Nausea and vomiting, unspecified vomiting type    Rx / DC Orders ED Discharge Orders     None         Al Decant, PA-C 05/12/22 2056    Vanetta Mulders, MD 05/12/22 [id number]

## 2022-05-12 NOTE — ED Triage Notes (Signed)
Pt arrives for episode of emesis after coughing. Pt states he was drinking and started choking, then began vomiting. Pt reports constant centralized cp x1 week along w/ cough. Denies fevers/chills

## 2022-05-20 DIAGNOSIS — F640 Transsexualism: Secondary | ICD-10-CM | POA: Diagnosis not present

## 2022-09-16 ENCOUNTER — Other Ambulatory Visit: Payer: Self-pay | Admitting: Internal Medicine

## 2022-09-16 DIAGNOSIS — N939 Abnormal uterine and vaginal bleeding, unspecified: Secondary | ICD-10-CM

## 2022-09-16 DIAGNOSIS — F4310 Post-traumatic stress disorder, unspecified: Secondary | ICD-10-CM | POA: Diagnosis not present

## 2022-09-17 ENCOUNTER — Ambulatory Visit
Admission: RE | Admit: 2022-09-17 | Discharge: 2022-09-17 | Disposition: A | Discharge status: Home / Self Care | Payer: BC Managed Care – PPO | Source: Ambulatory Visit | Attending: Internal Medicine | Admitting: Internal Medicine

## 2022-09-17 DIAGNOSIS — N939 Abnormal uterine and vaginal bleeding, unspecified: Secondary | ICD-10-CM

## 2022-09-30 DIAGNOSIS — R102 Pelvic and perineal pain: Secondary | ICD-10-CM | POA: Diagnosis not present

## 2022-09-30 DIAGNOSIS — F4310 Post-traumatic stress disorder, unspecified: Secondary | ICD-10-CM | POA: Diagnosis not present

## 2022-09-30 DIAGNOSIS — R110 Nausea: Secondary | ICD-10-CM | POA: Diagnosis not present

## 2022-09-30 DIAGNOSIS — N939 Abnormal uterine and vaginal bleeding, unspecified: Secondary | ICD-10-CM | POA: Diagnosis not present

## 2022-10-16 DIAGNOSIS — Z124 Encounter for screening for malignant neoplasm of cervix: Secondary | ICD-10-CM | POA: Diagnosis not present

## 2022-10-16 DIAGNOSIS — N946 Dysmenorrhea, unspecified: Secondary | ICD-10-CM | POA: Diagnosis not present

## 2022-10-16 DIAGNOSIS — Z113 Encounter for screening for infections with a predominantly sexual mode of transmission: Secondary | ICD-10-CM | POA: Diagnosis not present

## 2022-10-16 DIAGNOSIS — Z01419 Encounter for gynecological examination (general) (routine) without abnormal findings: Secondary | ICD-10-CM | POA: Diagnosis not present

## 2022-10-16 DIAGNOSIS — N898 Other specified noninflammatory disorders of vagina: Secondary | ICD-10-CM | POA: Diagnosis not present

## 2022-10-24 DIAGNOSIS — Z3202 Encounter for pregnancy test, result negative: Secondary | ICD-10-CM | POA: Diagnosis not present

## 2022-10-24 DIAGNOSIS — Z30017 Encounter for initial prescription of implantable subdermal contraceptive: Secondary | ICD-10-CM | POA: Diagnosis not present

## 2022-10-28 DIAGNOSIS — F4310 Post-traumatic stress disorder, unspecified: Secondary | ICD-10-CM | POA: Diagnosis not present

## 2022-10-28 DIAGNOSIS — Z23 Encounter for immunization: Secondary | ICD-10-CM | POA: Diagnosis not present
